# Patient Record
Sex: Female | Born: 1965 | Race: White | Hispanic: No | Smoking: Current every day smoker
Health system: Southern US, Community
[De-identification: ages and names within clinical notes are randomized; demographics above are authoritative.]

## PROBLEM LIST (undated history)

## (undated) DIAGNOSIS — J449 Chronic obstructive pulmonary disease, unspecified: Secondary | ICD-10-CM

## (undated) DIAGNOSIS — K219 Gastro-esophageal reflux disease without esophagitis: Secondary | ICD-10-CM

## (undated) DIAGNOSIS — F419 Anxiety disorder, unspecified: Secondary | ICD-10-CM

## (undated) DIAGNOSIS — M549 Dorsalgia, unspecified: Secondary | ICD-10-CM

## (undated) DIAGNOSIS — Q796 Ehlers-Danlos syndrome, unspecified: Secondary | ICD-10-CM

## (undated) HISTORY — PX: ABDOMINAL HYSTERECTOMY: SHX81

## (undated) HISTORY — PX: TONSILLECTOMY: SUR1361

## (undated) HISTORY — PX: BACK SURGERY: SHX140

## (undated) HISTORY — PX: KNEE ARTHROSCOPY: SUR90

---

## 2009-08-10 ENCOUNTER — Encounter: Payer: Self-pay | Admitting: Emergency Medicine

## 2009-08-10 ENCOUNTER — Ambulatory Visit: Payer: Self-pay | Admitting: Diagnostic Radiology

## 2009-08-11 ENCOUNTER — Inpatient Hospital Stay (HOSPITAL_COMMUNITY): Admission: EM | Admit: 2009-08-11 | Discharge: 2009-08-12 | Payer: Self-pay | Admitting: Internal Medicine

## 2009-08-17 ENCOUNTER — Ambulatory Visit: Payer: Self-pay | Admitting: Diagnostic Radiology

## 2009-08-17 ENCOUNTER — Ambulatory Visit (HOSPITAL_BASED_OUTPATIENT_CLINIC_OR_DEPARTMENT_OTHER): Admission: RE | Admit: 2009-08-17 | Discharge: 2009-08-17 | Payer: Self-pay | Admitting: Family Medicine

## 2009-08-27 ENCOUNTER — Ambulatory Visit (HOSPITAL_COMMUNITY): Admission: RE | Admit: 2009-08-27 | Discharge: 2009-08-27 | Payer: Self-pay | Admitting: Family Medicine

## 2010-10-07 IMAGING — CT CT ABDOMEN W/ CM
2 of 5 series · 16 of 46 positions shown, 18 images · IV contrast (APPLIED)
Comparison: None

CT ABDOMEN

CLINICAL DATA: Right-sided abdominal pain.

CT ABDOMEN AND PELVIS WITH CONTRAST
TECHNIQUE: Multidetector CT imaging of the abdomen and pelvis was
performed using the standard protocol following bolus
administration of intravenous contrast.
Contrast: 100 ml of [id number]

[Series 2: abd/pelvis 5.0 b31f · axial · 0.79mm/px · z∈[-560,-160]mm · 13 of 91 slices shown, 15 images]
[im 6/91  soft-tissue]
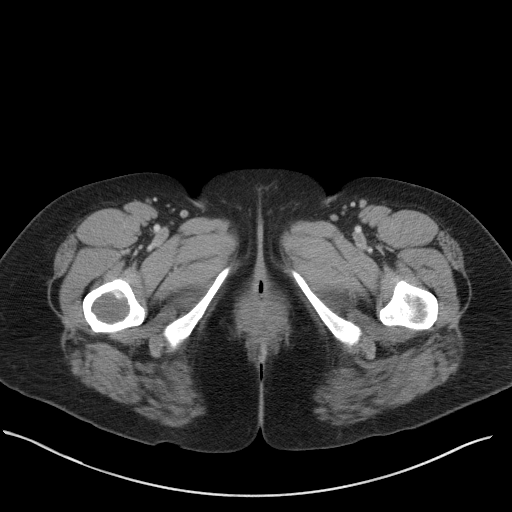
[im 6/91  bone]
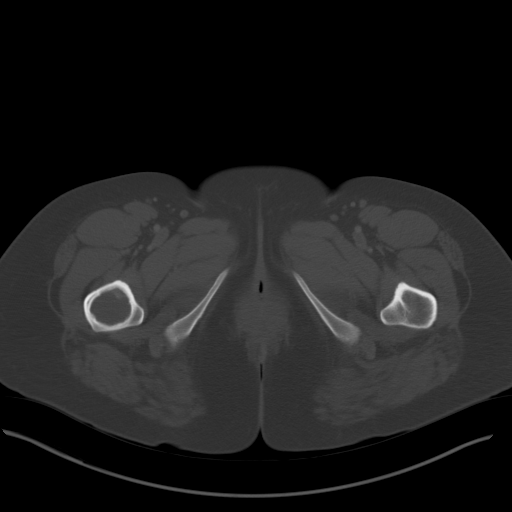
[im 11/91  soft-tissue]
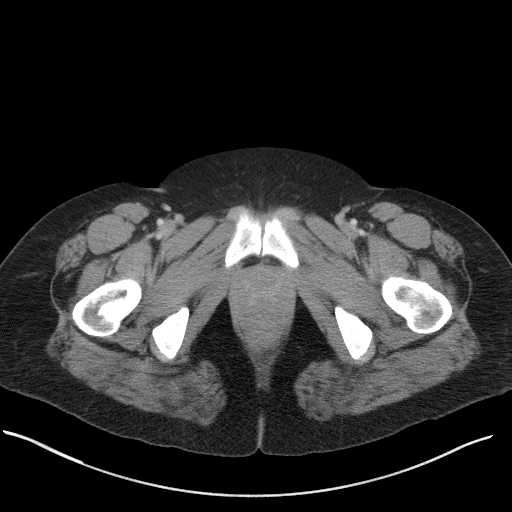
[im 21/91  soft-tissue]
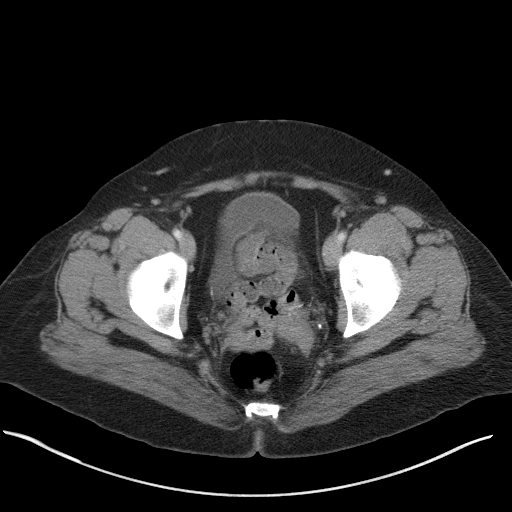
[im 26/91  soft-tissue]
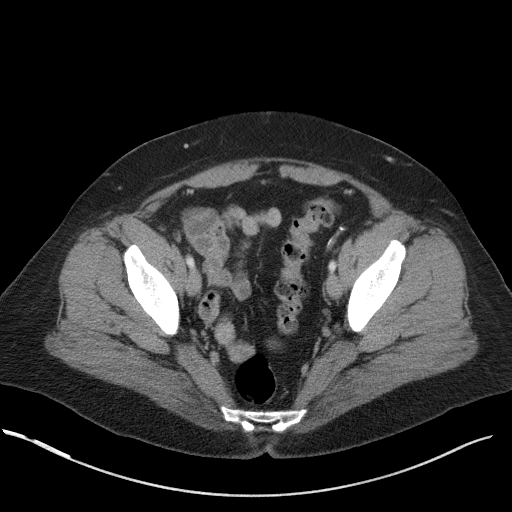
[im 31/91  soft-tissue]
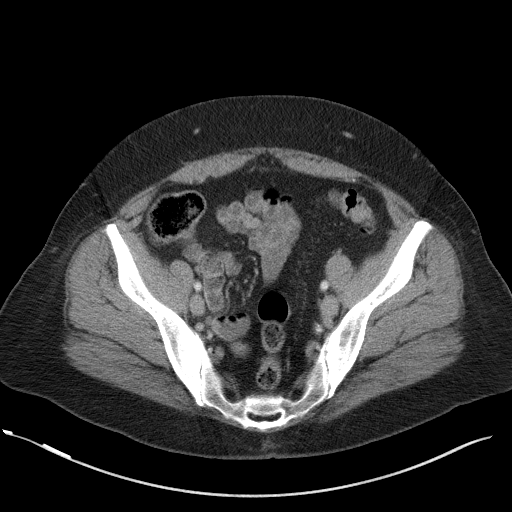
[im 41/91  soft-tissue]
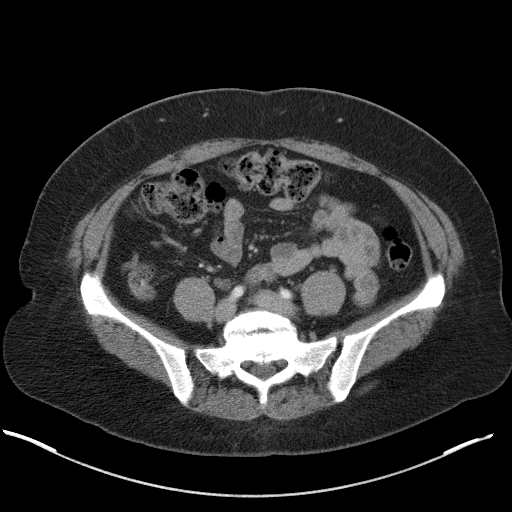
[im 46/91  soft-tissue]
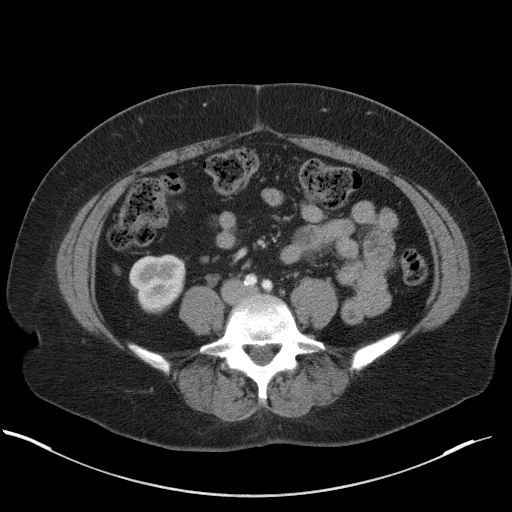
[im 51/91  soft-tissue]
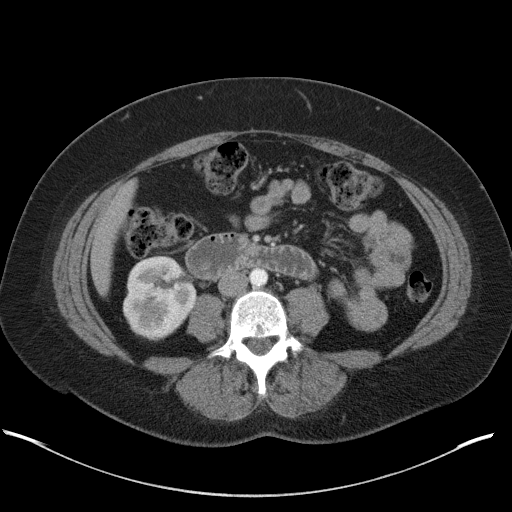
[im 61/91  soft-tissue]
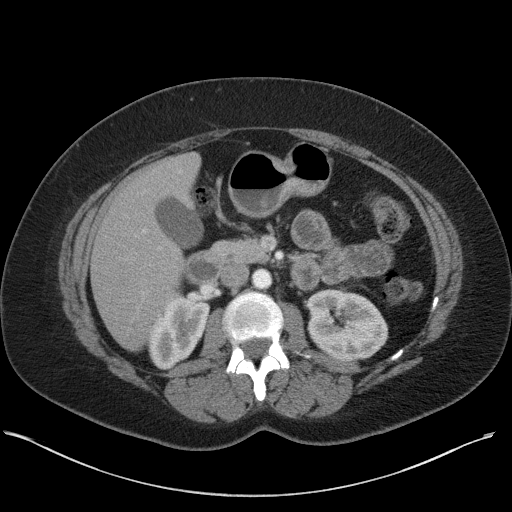
[im 61/91  bone]
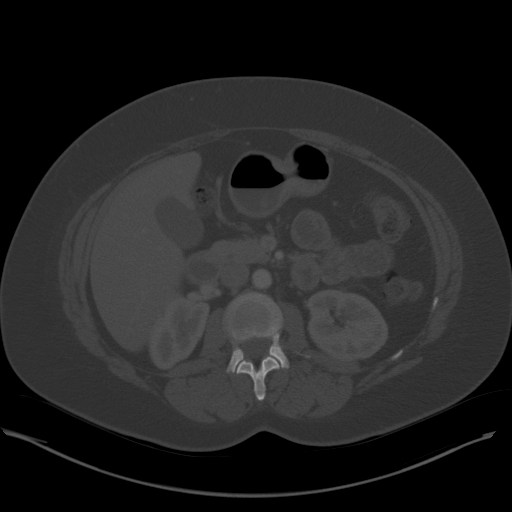
[im 66/91  soft-tissue]
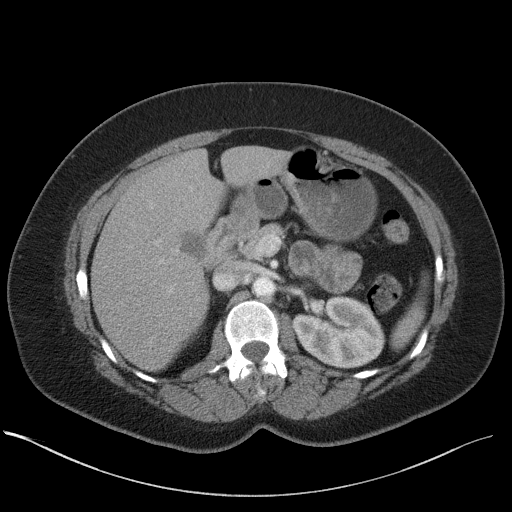
[im 71/91  soft-tissue]
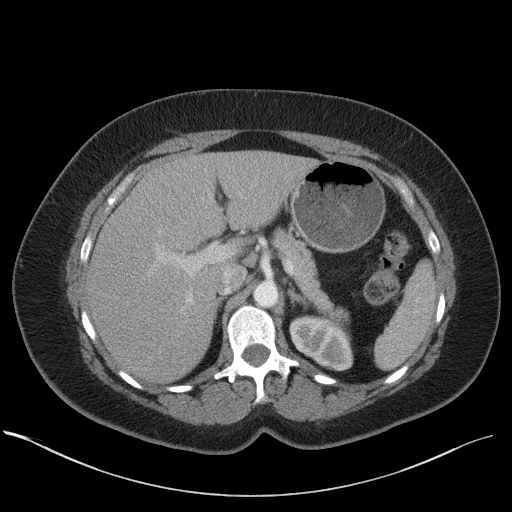
[im 81/91  soft-tissue]
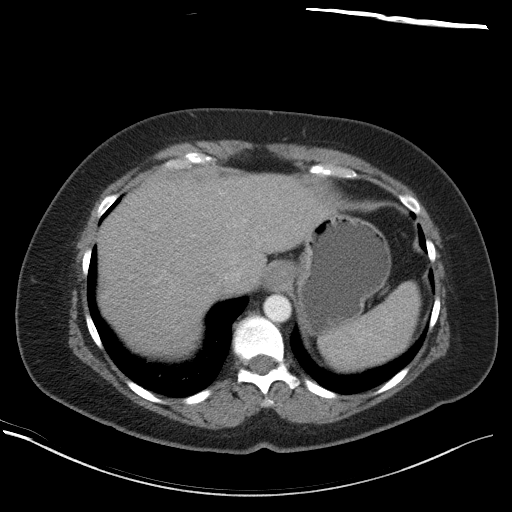
[im 86/91  soft-tissue]
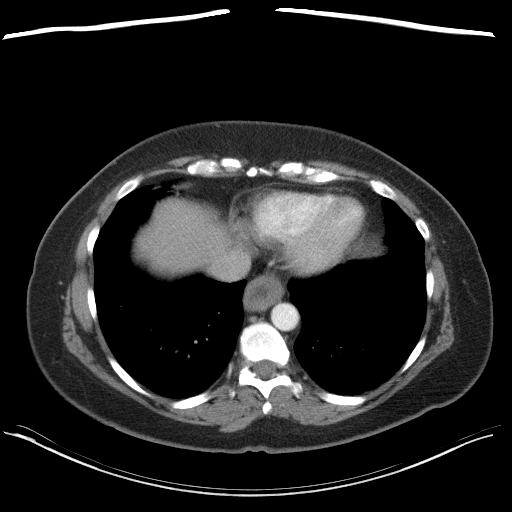

[Series 5: abd/pelvis 3.0 coronal · coronal · 0.86mm/px · 3 of 93 slices shown]
[im 31/93  soft-tissue]
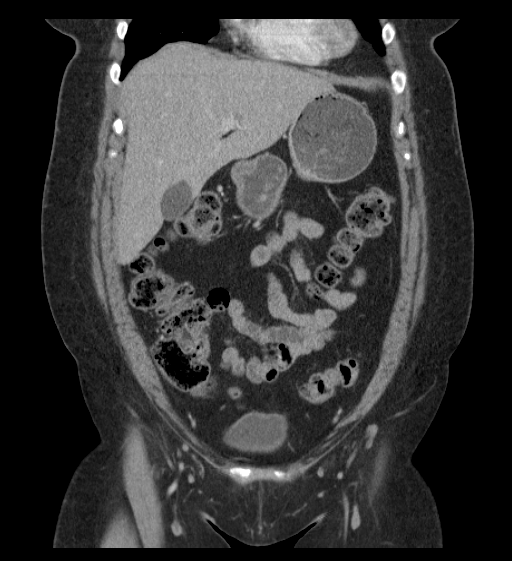
[im 41/93  soft-tissue]
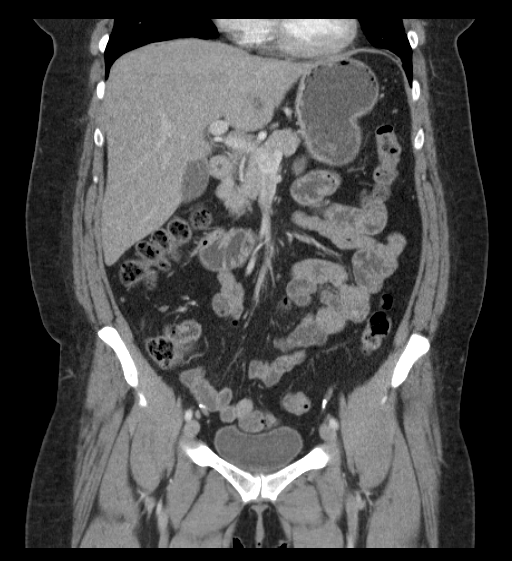
[im 52/93  soft-tissue]
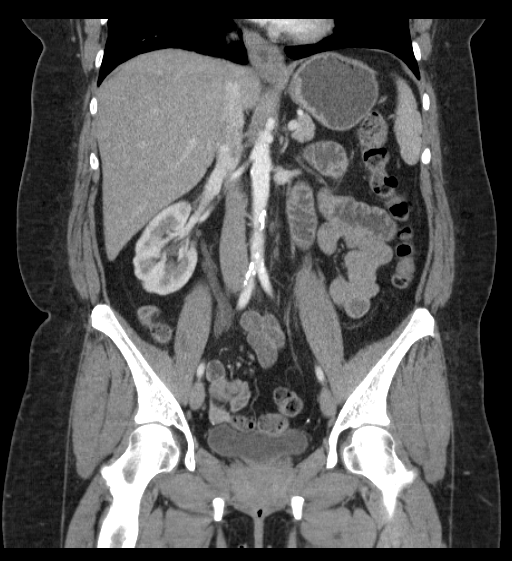

[16 of 46 positions shown; findings below may reference images not displayed]

FINDINGS: The lung bases are clear except for dependent
atelectasis.  The distal esophagus demonstrates marked wall
thickening and is fluid distended.  Findings may be secondary to
reflux esophagitis.  Recommend clinical correlation.  There are
esophagram or endoscopy may be helpful for further evaluation.

The liver, spleen, pancreas, adrenal glands and kidneys are
unremarkable.  The stomach, duodenum, small bowel and colon are
unremarkable.

The aorta is normal in caliber.  Major branch vessels are normal.
No mesenteric or retroperitoneal masses or adenopathy.

The bony structures are unremarkable.
IMPRESSION: 1.  Marked distal esophageal wall thickening and a fluid-filled
distended esophagus may be secondary to reflux esophagitis.
Correlation with barium esophagram or endoscopy is suggested.
2.  No acute abdominal findings, mass lesions or adenopathy.

CT PELVIS
FINDINGS: The rectum, sigmoid colon visualized small bowel loops
are unremarkable.  The appendix is normal.  The bladder appears
normal.  No distal ureteral or bladder calculi.  No pelvic mass,
adenopathy or free pelvic fluid collections.  No inguinal mass or
hernia.  The bony pelvis is intact.  There is a lytic lesion in the
posterior aspect of the acetabulum and a large lesion involving the
proximal femur.  Findings could be secondary to fibrous dysplasia
or bone cysts.  MRI suggested for further evaluation.

No inguinal mass or hernia.
IMPRESSION: 1.  No acute pelvic findings, mass lesions or adenopathy.
2.  Lytic lesions in the right femur and acetabulum.  MRI suggested
for further evaluation.

## 2011-02-18 LAB — DIFFERENTIAL
Basophils Absolute: 0 10*3/uL (ref 0.0–0.1)
Basophils Relative: 0 % (ref 0–1)
Eosinophils Absolute: 0 10*3/uL (ref 0.0–0.7)
Eosinophils Absolute: 0.2 10*3/uL (ref 0.0–0.7)
Eosinophils Relative: 0 % (ref 0–5)
Lymphocytes Relative: 27 % (ref 12–46)
Lymphs Abs: 1.1 10*3/uL (ref 0.7–4.0)
Monocytes Relative: 5 % (ref 3–12)
Neutrophils Relative %: 67 % (ref 43–77)

## 2011-02-18 LAB — COMPREHENSIVE METABOLIC PANEL
AST: 21 U/L (ref 0–37)
Albumin: 3.1 g/dL — ABNORMAL LOW (ref 3.5–5.2)
Alkaline Phosphatase: 87 U/L (ref 39–117)
BUN: 17 mg/dL (ref 6–23)
CO2: 28 mEq/L (ref 19–32)
Calcium: 9.9 mg/dL (ref 8.4–10.5)
Chloride: 105 mEq/L (ref 96–112)
GFR calc Af Amer: >60 mL/min (ref >60)
GFR calc non Af Amer: >60 mL/min (ref >60)
Glucose, Bld: 123 mg/dL — ABNORMAL HIGH (ref 70–99)
Sodium: 141 mEq/L (ref 135–145)
Total Bilirubin: 0.3 mg/dL (ref 0.3–1.2)
Total Bilirubin: 0.5 mg/dL (ref 0.3–1.2)
Total Protein: 7.9 g/dL (ref 6.0–8.3)

## 2011-02-18 LAB — URINALYSIS, ROUTINE W REFLEX MICROSCOPIC
Hgb urine dipstick: NEGATIVE
Ketones, ur: NEGATIVE mg/dL
Protein, ur: NEGATIVE mg/dL
Specific Gravity, Urine: 1.004 — ABNORMAL LOW (ref 1.005–1.030)
Urobilinogen, UA: 0.2 mg/dL (ref 0.0–1.0)
pH: 6 (ref 5.0–8.0)

## 2011-02-18 LAB — CBC
HCT: 35.6 % — ABNORMAL LOW (ref 36.0–46.0)
MCHC: 34.5 g/dL (ref 30.0–36.0)
MCV: 85.8 fL (ref 78.0–100.0)
RBC: 4.16 MIL/uL (ref 3.87–5.11)
RBC: 4.7 MIL/uL (ref 3.87–5.11)
WBC: 16.6 10*3/uL — ABNORMAL HIGH (ref 4.0–10.5)

## 2011-02-18 LAB — RAPID URINE DRUG SCREEN, HOSP PERFORMED
Barbiturates: NOT DETECTED
Benzodiazepines: NOT DETECTED
Cocaine: NOT DETECTED

## 2011-02-18 LAB — LACTIC ACID, PLASMA: Lactic Acid, Venous: 1.1 mmol/L (ref 0.5–2.2)

## 2013-07-24 LAB — MAGNESIUM: Magnesium: 1.7 mg/dL (ref 1.6–2.4)

## 2013-07-24 LAB — VITAMIN D 25 HYDROXY (VIT D DEFICIENCY, FRACTURES): Vit D, 25-Hydroxy: 25.8

## 2013-07-24 LAB — TSH: TSH: 0.64

## 2013-12-02 ENCOUNTER — Encounter: Payer: Self-pay | Admitting: Emergency Medicine

## 2013-12-02 ENCOUNTER — Emergency Department (INDEPENDENT_AMBULATORY_CARE_PROVIDER_SITE_OTHER)
Admission: EM | Admit: 2013-12-02 | Discharge: 2013-12-02 | Disposition: A | Discharge status: Home / Self Care | Payer: BC Managed Care – PPO | Source: Home / Self Care | Attending: Family Medicine | Admitting: Family Medicine

## 2013-12-02 DIAGNOSIS — J111 Influenza due to unidentified influenza virus with other respiratory manifestations: Secondary | ICD-10-CM

## 2013-12-02 DIAGNOSIS — R05 Cough: Secondary | ICD-10-CM

## 2013-12-02 DIAGNOSIS — R69 Illness, unspecified: Principal | ICD-10-CM

## 2013-12-02 DIAGNOSIS — R059 Cough, unspecified: Secondary | ICD-10-CM

## 2013-12-02 DIAGNOSIS — R112 Nausea with vomiting, unspecified: Secondary | ICD-10-CM

## 2013-12-02 DIAGNOSIS — R509 Fever, unspecified: Secondary | ICD-10-CM

## 2013-12-02 HISTORY — DX: Ehlers-Danlos syndrome, unspecified: Q79.60

## 2013-12-02 HISTORY — DX: Dorsalgia, unspecified: M54.9

## 2013-12-02 HISTORY — DX: Chronic obstructive pulmonary disease, unspecified: J44.9

## 2013-12-02 HISTORY — DX: Gastro-esophageal reflux disease without esophagitis: K21.9

## 2013-12-02 HISTORY — DX: Anxiety disorder, unspecified: F41.9

## 2013-12-02 LAB — POCT INFLUENZA A/B
INFLUENZA A, POC: NEGATIVE
Influenza B, POC: NEGATIVE

## 2013-12-02 MED ORDER — ONDANSETRON 4 MG PO TBDP: 4.0000 mg | ORAL_TABLET | Freq: Once | ORAL | Status: DC

## 2013-12-02 MED ORDER — HYDROCOD POLST-CHLORPHEN POLST 10-8 MG/5ML PO LQCR: 5.0000 mL | Freq: Every evening | ORAL | Status: DC | PRN

## 2013-12-02 MED ORDER — ONDANSETRON 4 MG PO TBDP: ORAL_TABLET | ORAL | Status: DC

## 2013-12-02 MED ORDER — BENZONATATE 200 MG PO CAPS: 200.0000 mg | ORAL_CAPSULE | Freq: Every day | ORAL | Status: DC

## 2013-12-02 MED ORDER — OSELTAMIVIR PHOSPHATE 75 MG PO CAPS: 75.0000 mg | ORAL_CAPSULE | Freq: Two times a day (BID) | ORAL | Status: DC

## 2013-12-02 MED ORDER — CEFDINIR 300 MG PO CAPS: 300.0000 mg | ORAL_CAPSULE | Freq: Two times a day (BID) | ORAL | Status: DC

## 2013-12-02 NOTE — Discharge Instructions (Signed)
Take plain Mucinex (1200 mg guaifenesin) twice daily for cough and congestion.  May add Sudafed for sinus congestion.   Begin clear liquids for about 12 hours until nausea improves, then advance diet.  Rest, and continue increased fluid intake. May use Afrin nasal spray (or generic oxymetazoline) twice daily for about 5 days.  Also recommend using saline nasal spray several times daily and saline nasal irrigation (AYR is a common brand) Try warm salt water gargles for sore throat.  Stop all antihistamines for now, and other non-prescription cough/cold preparations. May take Ibuprofen 200mg , 4 tabs every 8 hours with food for chest/sternum discomfort, body aches, fever, etc. If symptoms become significantly worse during the night or over the weekend, proceed to the local emergency room.

## 2013-12-02 NOTE — ED Notes (Signed)
Reports cough, congestion, fever and body aches since yesterday. No flu vaccination this season. Took Goodie Powder one hour ago.

## 2013-12-02 NOTE — ED Provider Notes (Signed)
CSN: [id number]     Arrival date & time 12/02/13  1105 History   First MD Initiated Contact with Patient 12/02/13 1148     Chief Complaint  Patient presents with  . Cough  . Nasal Congestion  . Fever  . Generalized Body Aches     HPI Comments: Patient awoke yesterday with nausea/vomiting, fatigue, headache, chills, mild sore throat, non-productive cough, and nasal congestion.  She has occasional shortness of breath and wheezing.  She had a temperature to 100 last night.  Her vomiting has resolved, but she still has nausea. She has a history of COPD, and continues to smoke.  She has had frequent episodes of bronchitis in the past, and pneumonia about 4 years ago.  She uses Symbicort twice daily.  The history is provided by the patient.    Past Medical History  Diagnosis Date  . COPD (chronic obstructive pulmonary disease)   . GERD (gastroesophageal reflux disease)   . Anxiety   . Back pain   . Fibrodysplasia elastica generalisata    Past Surgical History  Procedure Laterality Date  . Tonsillectomy    . Abdominal hysterectomy    . Knee arthroscopy Left   . Back surgery     History reviewed. No pertinent family history. History  Substance Use Topics  . Smoking status: Current Every Day Smoker  . Smokeless tobacco: Not on file  . Alcohol Use: Yes   OB History   Grav Para Term Preterm Abortions TAB SAB Ect Mult Living                 Review of Systems + sore throat + cough No pleuritic pain + wheezing + nasal congestion + post-nasal drainage No sinus pain/pressure No itchy/red eyes ? earache No hemoptysis + SOB + fever, + chills + nausea + vomiting, resolved No abdominal pain No diarrhea No urinary symptoms No skin rash + fatigue + myalgias + headache Used OTC meds without relief  Allergies  Codeine  Home Medications   Current Outpatient Rx  Name  Route  Sig  Dispense  Refill  . ALPRAZolam (XANAX) 0.5 MG tablet   Oral   Take 0.5 mg by mouth at  bedtime as needed for anxiety.         . budesonide-formoterol (SYMBICORT) 160-4.5 MCG/ACT inhaler   Inhalation   Inhale 2 puffs into the lungs 2 (two) times daily.         Marland Kitchen estradiol (VIVELLE-DOT) 0.025 MG/24HR   Transdermal   Place 1 patch onto the skin 2 (two) times a week.         Marland Kitchen omeprazole (PRILOSEC) 20 MG capsule   Oral   Take 20 mg by mouth daily.         . benzonatate (TESSALON) 200 MG capsule   Oral   Take 1 capsule (200 mg total) by mouth at bedtime. Take as needed for cough   12 capsule   0   . cefdinir (OMNICEF) 300 MG capsule   Oral   Take 1 capsule (300 mg total) by mouth 2 (two) times daily.   20 capsule   0   . ondansetron (ZOFRAN ODT) 4 MG disintegrating tablet      Take one tab by mouth Q6hr prn nausea   12 tablet   0   . oseltamivir (TAMIFLU) 75 MG capsule   Oral   Take 1 capsule (75 mg total) by mouth every 12 (twelve) hours.   10 capsule  0    BP 143/87  Pulse 89  Temp(Src) 97.9 F (36.6 C) (Oral)  Resp 18  Ht 5\' 2"  (1.575 m)  Wt 192 lb (87.091 kg)  BMI 35.11 kg/m2  SpO2 99% Physical Exam Nursing notes and Vital Signs reviewed. Appearance:  Patient appears stated age, and in no acute distress.  Patient is obese (BMI 35.1) Eyes:  Pupils are equal, round, and reactive to light and accomodation.  Extraocular movement is intact.  Conjunctivae are not inflamed  Ears:  Canals normal.  Tympanic membranes normal.  Nose:  Mildly congested turbinates.  No sinus tenderness.    Pharynx:  Normal Neck:  Supple.   Tender shotty left posterior nodes are palpated  Lungs:  Clear to auscultation.  Breath sounds are equal. Chest:  Distinct tenderness to palpation over the mid-sternum.   Heart:  Regular rate and rhythm without murmurs, rubs, or gallops.  Abdomen:  Nontender without masses or hepatosplenomegaly.  Bowel sounds are present.  No CVA or flank tenderness.  Extremities:  No edema.  No calf tenderness Skin:  No rash present.   ED  Course  Procedures  None    Labs Reviewed  POCT INFLUENZA A/B negative         MDM   1. Influenza-like illness   2. Nausea with vomiting    Administered oral Zofran ODT 4mg  po Despite negative flu test, will begin Tamiflu. With a history of COPD, pneumonia, and frequent bronchitis, will begin empiric Omnicef. Take plain Mucinex (1200 mg guaifenesin) twice daily for cough and congestion.  May add Sudafed for sinus congestion.   Begin clear liquids for about 12 hours until nausea improves, then advance diet.  Rest, and continue increased fluid intake. May use Afrin nasal spray (or generic oxymetazoline) twice daily for about 5 days.  Also recommend using saline nasal spray several times daily and saline nasal irrigation (AYR is a common brand) Try warm salt water gargles for sore throat.  Stop all antihistamines for now, and other non-prescription cough/cold preparations. May take Ibuprofen 200mg , 4 tabs every 8 hours with food for chest/sternum discomfort, body aches, fever, etc. Followup with Family Doctor if not improved in one week.  If symptoms become significantly worse during the night or over the weekend, proceed to the local emergency room.  Addendum:  Call from pharmacy; Tessalon on backorder.  Well substitute tussionex 5cc at bedtime (she states that she has no adverse effects from hydrocodone).    Lattie HawStephen A Azul Coffie, MD 12/02/13 [phone]

## 2013-12-04 ENCOUNTER — Telehealth: Payer: Self-pay | Admitting: *Deleted

## 2013-12-04 ENCOUNTER — Emergency Department (INDEPENDENT_AMBULATORY_CARE_PROVIDER_SITE_OTHER)
Admission: EM | Admit: 2013-12-04 | Discharge: 2013-12-04 | Disposition: A | Discharge status: Home / Self Care | Payer: BC Managed Care – PPO | Source: Home / Self Care | Attending: Family Medicine | Admitting: Family Medicine

## 2013-12-04 ENCOUNTER — Emergency Department (INDEPENDENT_AMBULATORY_CARE_PROVIDER_SITE_OTHER): Payer: BC Managed Care – PPO

## 2013-12-04 ENCOUNTER — Encounter: Payer: Self-pay | Admitting: Emergency Medicine

## 2013-12-04 DIAGNOSIS — R69 Illness, unspecified: Principal | ICD-10-CM

## 2013-12-04 DIAGNOSIS — R053 Chronic cough: Secondary | ICD-10-CM

## 2013-12-04 DIAGNOSIS — R509 Fever, unspecified: Secondary | ICD-10-CM

## 2013-12-04 DIAGNOSIS — J111 Influenza due to unidentified influenza virus with other respiratory manifestations: Secondary | ICD-10-CM

## 2013-12-04 DIAGNOSIS — R059 Cough, unspecified: Secondary | ICD-10-CM

## 2013-12-04 DIAGNOSIS — R05 Cough: Secondary | ICD-10-CM

## 2013-12-04 DIAGNOSIS — Z8701 Personal history of pneumonia (recurrent): Secondary | ICD-10-CM

## 2013-12-04 LAB — POCT CBC W AUTO DIFF (K'VILLE URGENT CARE)

## 2013-12-04 MED ORDER — PREDNISONE 20 MG PO TABS: 20.0000 mg | ORAL_TABLET | Freq: Two times a day (BID) | ORAL | Status: DC

## 2013-12-04 NOTE — Discharge Instructions (Signed)
Continue all present medications and inhalers.  Continue increased fluids.

## 2013-12-04 NOTE — ED Provider Notes (Signed)
CSN: [id number]     Arrival date & time 12/04/13  1133 History   First MD Initiated Contact with Patient 12/04/13 1150     Chief Complaint  Patient presents with  . Cough      HPI Comments: Patient was evaluated here two days ago for a respiratory infection and treated with Tamiflu and Omnicef.  She complains of persistent non-productive cough and chills/sweats.  She has had persistent low grade fever to 99.1.  She has had a persistent sore throat and wheezing, but minimal sinus congestion.  No pleuritic pain.  She continues to use her Symbicort and rescue albuterol MDI.   The history is provided by the patient and the spouse.    Past Medical History  Diagnosis Date  . COPD (chronic obstructive pulmonary disease)   . GERD (gastroesophageal reflux disease)   . Anxiety   . Back pain   . Fibrodysplasia elastica generalisata    Past Surgical History  Procedure Laterality Date  . Tonsillectomy    . Abdominal hysterectomy    . Knee arthroscopy Left   . Back surgery     History reviewed. No pertinent family history. History  Substance Use Topics  . Smoking status: Current Every Day Smoker  . Smokeless tobacco: Not on file  . Alcohol Use: Yes   OB History   Grav Para Term Preterm Abortions TAB SAB Ect Mult Living                 Review of Systems + sore throat + cough No pleuritic pain + wheezing + nasal congestion + post-nasal drainage ? sinus pain/pressure No itchy/red eyes No earache No hemoptysis No SOB + fever, + chills No nausea + vomiting after cough No abdominal pain No diarrhea No urinary symptoms No skin rash + fatigue + myalgias + headache    Allergies  Codeine  Home Medications   Current Outpatient Rx  Name  Route  Sig  Dispense  Refill  . ALPRAZolam (XANAX) 0.5 MG tablet   Oral   Take 0.5 mg by mouth at bedtime as needed for anxiety.         . budesonide-formoterol (SYMBICORT) 160-4.5 MCG/ACT inhaler   Inhalation   Inhale 2 puffs into  the lungs 2 (two) times daily.         . cefdinir (OMNICEF) 300 MG capsule   Oral   Take 1 capsule (300 mg total) by mouth 2 (two) times daily.   20 capsule   0   . chlorpheniramine-HYDROcodone (TUSSIONEX PENNKINETIC ER) 10-8 MG/5ML LQCR   Oral   Take 5 mLs by mouth at bedtime as needed for cough.   60 mL   0   . estradiol (VIVELLE-DOT) 0.025 MG/24HR   Transdermal   Place 1 patch onto the skin 2 (two) times a week.         Marland Kitchen omeprazole (PRILOSEC) 20 MG capsule   Oral   Take 20 mg by mouth daily.         . ondansetron (ZOFRAN ODT) 4 MG disintegrating tablet      Take one tab by mouth Q6hr prn nausea   12 tablet   0   . oseltamivir (TAMIFLU) 75 MG capsule   Oral   Take 1 capsule (75 mg total) by mouth every 12 (twelve) hours.   10 capsule   0   . predniSONE (DELTASONE) 20 MG tablet   Oral   Take 1 tablet (20 mg total) by mouth  2 (two) times daily. Take with food.   10 tablet   0    BP 139/92  Pulse 110  Temp(Src) 98.8 F (37.1 C) (Oral)  Resp 20  Ht 5\' 2"  (1.575 m)  Wt 194 lb (87.998 kg)  BMI 35.47 kg/m2  SpO2 96% Physical Exam Nursing notes and Vital Signs reviewed. Appearance:  Patient appears stated age, and in no acute distress.  Patient is obese (BMI 35.5) Eyes:  Pupils are equal, round, and reactive to light and accomodation.  Extraocular movement is intact.  Conjunctivae are not inflamed  Ears:  Canals normal.  Tympanic membranes normal.  Nose:  Mildly congested turbinates.  No sinus tenderness.   Pharynx:  Normal Neck:  Supple.  Slightly tender shotty posterior nodes are palpated bilaterally  Lungs:  Clear to auscultation.  Breath sounds are equal.  Heart:  Regular rate and rhythm without murmurs, rubs, or gallops.  Abdomen:  Nontender without masses or hepatosplenomegaly.  Bowel sounds are present.  No CVA or flank tenderness.  Extremities:  No edema.  No calf tenderness Skin:  No rash present.   ED Course  Procedures  none    Labs  Reviewed  POCT CBC W AUTO DIFF ([facility]) WBC 9.6; LY 32.2; MO 4.6; GR 63.2; Hgb 15.9; Platelets 154    Imaging Review Dg Chest 2 View  12/04/2013   CLINICAL DATA:  Cough and fever.  EXAM: CHEST  2 VIEW  COMPARISON:  None.  FINDINGS: The heart size and mediastinal contours are within normal limits. Both lungs are clear. The visualized skeletal structures are unremarkable.  IMPRESSION: No active cardiopulmonary disease.   Electronically Signed   By: Marlan Palauharles  Clark M.D.   On: 12/04/2013 12:50      MDM   1. Influenza-like illness   2. Persistent dry cough; normal WBC is reassuring    Begin prednisone burst. Continue all present medications and inhalers.  Continue increased fluids. Followup with Family Doctor if not improved in one week.     Lattie HawStephen A Beese, MD 12/04/13 2042

## 2013-12-04 NOTE — ED Notes (Signed)
The pt is here today for a recheck from her visit 2 days ago. She reports that she is no better.

## 2014-06-09 ENCOUNTER — Encounter: Payer: Self-pay | Admitting: Physician Assistant

## 2014-06-09 ENCOUNTER — Ambulatory Visit (INDEPENDENT_AMBULATORY_CARE_PROVIDER_SITE_OTHER): Payer: BC Managed Care – PPO | Admitting: Physician Assistant

## 2014-06-09 VITALS — BP 119/76 | HR 102 | Ht 64.5 in | Wt 195.0 lb

## 2014-06-09 DIAGNOSIS — J449 Chronic obstructive pulmonary disease, unspecified: Secondary | ICD-10-CM | POA: Insufficient documentation

## 2014-06-09 DIAGNOSIS — N951 Menopausal and female climacteric states: Secondary | ICD-10-CM

## 2014-06-09 DIAGNOSIS — K219 Gastro-esophageal reflux disease without esophagitis: Secondary | ICD-10-CM

## 2014-06-09 DIAGNOSIS — F4310 Post-traumatic stress disorder, unspecified: Secondary | ICD-10-CM | POA: Diagnosis not present

## 2014-06-09 DIAGNOSIS — Z860100 Personal history of colon polyps, unspecified: Secondary | ICD-10-CM | POA: Diagnosis not present

## 2014-06-09 DIAGNOSIS — Q796 Ehlers-Danlos syndrome, unspecified: Secondary | ICD-10-CM | POA: Diagnosis not present

## 2014-06-09 DIAGNOSIS — J410 Simple chronic bronchitis: Secondary | ICD-10-CM | POA: Diagnosis not present

## 2014-06-09 DIAGNOSIS — Z780 Asymptomatic menopausal state: Secondary | ICD-10-CM

## 2014-06-09 MED ORDER — ALPRAZOLAM 0.5 MG PO TABS: 0.5000 mg | ORAL_TABLET | Freq: Three times a day (TID) | ORAL | Status: DC | PRN

## 2014-06-09 MED ORDER — TRAMADOL HCL 50 MG PO TABS: ORAL_TABLET | ORAL | Status: DC

## 2014-06-09 MED ORDER — BUDESONIDE-FORMOTEROL FUMARATE 160-4.5 MCG/ACT IN AERO: 2.0000 | INHALATION_SPRAY | Freq: Two times a day (BID) | RESPIRATORY_TRACT | Status: DC

## 2014-06-09 MED ORDER — ESTRADIOL 0.025 MG/24HR TD PTTW: 1.0000 | MEDICATED_PATCH | TRANSDERMAL | Status: DC

## 2014-06-09 MED ORDER — OMEPRAZOLE 20 MG PO CPDR: 20.0000 mg | DELAYED_RELEASE_CAPSULE | Freq: Two times a day (BID) | ORAL | Status: DC
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
## 2014-06-10 ENCOUNTER — Other Ambulatory Visit: Payer: Self-pay | Admitting: *Deleted

## 2014-06-10 ENCOUNTER — Encounter: Payer: Self-pay | Admitting: Physician Assistant

## 2014-06-10 DIAGNOSIS — N951 Menopausal and female climacteric states: Secondary | ICD-10-CM | POA: Insufficient documentation

## 2014-06-10 DIAGNOSIS — K219 Gastro-esophageal reflux disease without esophagitis: Secondary | ICD-10-CM | POA: Insufficient documentation

## 2014-06-10 MED ORDER — ALPRAZOLAM 0.5 MG PO TABS: 0.5000 mg | ORAL_TABLET | Freq: Three times a day (TID) | ORAL | Status: DC | PRN

## 2014-06-10 MED ORDER — ESTRADIOL 1 MG PO TABS: 1.0000 mg | ORAL_TABLET | Freq: Every morning | ORAL | Status: DC

## 2014-06-10 NOTE — Telephone Encounter (Signed)
Patient stopped in office for prescription correction. Kristi Odonnell, CMA

## 2014-08-24 ENCOUNTER — Other Ambulatory Visit: Payer: Self-pay | Admitting: Physician Assistant

## 2014-09-16 ENCOUNTER — Other Ambulatory Visit: Payer: Self-pay | Admitting: Physician Assistant

## 2014-10-31 ENCOUNTER — Encounter: Payer: Self-pay | Admitting: Physician Assistant

## 2014-10-31 ENCOUNTER — Ambulatory Visit (INDEPENDENT_AMBULATORY_CARE_PROVIDER_SITE_OTHER): Payer: BC Managed Care – PPO | Admitting: Physician Assistant

## 2014-10-31 VITALS — BP 137/88 | HR 88 | Ht 64.5 in | Wt 187.0 lb

## 2014-10-31 DIAGNOSIS — Z780 Asymptomatic menopausal state: Secondary | ICD-10-CM | POA: Diagnosis not present

## 2014-10-31 DIAGNOSIS — F4310 Post-traumatic stress disorder, unspecified: Secondary | ICD-10-CM | POA: Diagnosis not present

## 2014-10-31 DIAGNOSIS — J410 Simple chronic bronchitis: Secondary | ICD-10-CM

## 2014-10-31 DIAGNOSIS — K219 Gastro-esophageal reflux disease without esophagitis: Secondary | ICD-10-CM

## 2014-10-31 DIAGNOSIS — Q796 Ehlers-Danlos syndrome, unspecified: Secondary | ICD-10-CM

## 2014-10-31 DIAGNOSIS — N951 Menopausal and female climacteric states: Secondary | ICD-10-CM

## 2014-10-31 DIAGNOSIS — F411 Generalized anxiety disorder: Secondary | ICD-10-CM

## 2014-10-31 MED ORDER — CITALOPRAM HYDROBROMIDE 10 MG PO TABS: 10.0000 mg | ORAL_TABLET | Freq: Every day | ORAL | Status: DC

## 2014-10-31 MED ORDER — OMEPRAZOLE 20 MG PO CPDR: 20.0000 mg | DELAYED_RELEASE_CAPSULE | Freq: Two times a day (BID) | ORAL | Status: DC

## 2014-10-31 MED ORDER — BUDESONIDE-FORMOTEROL FUMARATE 160-4.5 MCG/ACT IN AERO: 2.0000 | INHALATION_SPRAY | Freq: Two times a day (BID) | RESPIRATORY_TRACT | Status: DC

## 2014-10-31 MED ORDER — ESTRADIOL 1 MG PO TABS: 1.0000 mg | ORAL_TABLET | Freq: Every morning | ORAL | Status: DC

## 2014-10-31 MED ORDER — BUDESONIDE-FORMOTEROL FUMARATE 160-4.5 MCG/ACT IN AERO
2.0000 | INHALATION_SPRAY | Freq: Two times a day (BID) | RESPIRATORY_TRACT | Status: AC
Start: 2014-10-31 — End: ?

## 2014-10-31 MED ORDER — TRAMADOL HCL 50 MG PO TABS: ORAL_TABLET | ORAL | Status: DC

## 2014-10-31 MED ORDER — ALPRAZOLAM 0.5 MG PO TABS: ORAL_TABLET | ORAL | Status: DC

## 2014-10-31 NOTE — Progress Notes (Signed)
   Subjective:    Patient ID: Kristi CambridgeCindy Mcmullen, female    DOB: 02-17-1966, 48 y.o.   MRN: [id number]  HPI Pt presents to the clinic for medication refills.   GERD, pain, and COPD well controlled.   She does feel more down and anxious. She has her xanax tht helps but feeling more need to use more often. Denies any suicidal or homicidal thoughts. Tried zoloft in the past and feels like she had bad dreams with it. Would liek to try something else. Social situations worsen anxiety.    Review of Systems  All other systems reviewed and are negative.      Objective:   Physical Exam  Constitutional: She is oriented to person, place, and time. She appears well-developed and well-nourished.  HENT:  Head: Normocephalic and atraumatic.  Cardiovascular: Normal rate, regular rhythm and normal heart sounds.   Pulmonary/Chest: Effort normal and breath sounds normal.  Neurological: She is alert and oriented to person, place, and time.  Skin: Skin is dry.  Psychiatric: She has a normal mood and affect. Her behavior is normal.          Assessment & Plan:  GAD/PTSD- GAD-7 was 10. PHQ-9 was 5. Refilled xanax. Started celexa due to worsening anxiety. Discussed side effects. Follow up in 4-6 weeks.   GERD- refilled for 6 months.   Fibrodysplasia- refilled tramadol.   COPD- given symbicort refills

## 2014-12-24 ENCOUNTER — Other Ambulatory Visit: Payer: Self-pay | Admitting: Physician Assistant

## 2015-01-23 ENCOUNTER — Other Ambulatory Visit: Payer: Self-pay | Admitting: Physician Assistant

## 2015-02-09 DIAGNOSIS — Z789 Other specified health status: Secondary | ICD-10-CM | POA: Insufficient documentation

## 2015-02-09 DIAGNOSIS — F329 Major depressive disorder, single episode, unspecified: Secondary | ICD-10-CM | POA: Insufficient documentation

## 2015-02-09 DIAGNOSIS — Z7289 Other problems related to lifestyle: Secondary | ICD-10-CM | POA: Insufficient documentation

## 2015-03-11 ENCOUNTER — Ambulatory Visit (INDEPENDENT_AMBULATORY_CARE_PROVIDER_SITE_OTHER): Payer: BLUE CROSS/BLUE SHIELD | Admitting: Physician Assistant

## 2015-03-11 ENCOUNTER — Encounter (HOSPITAL_COMMUNITY): Payer: Self-pay | Admitting: Physician Assistant

## 2015-03-11 VITALS — BP 130/86 | HR 85 | Ht 64.5 in | Wt 180.0 lb

## 2015-03-11 DIAGNOSIS — F411 Generalized anxiety disorder: Secondary | ICD-10-CM

## 2015-03-11 DIAGNOSIS — F331 Major depressive disorder, recurrent, moderate: Secondary | ICD-10-CM | POA: Diagnosis not present

## 2015-03-11 MED ORDER — ESCITALOPRAM OXALATE 10 MG PO TABS: 10.0000 mg | ORAL_TABLET | Freq: Every day | ORAL | Status: DC

## 2015-03-11 MED ORDER — BUSPIRONE HCL 5 MG PO TABS: 5.0000 mg | ORAL_TABLET | Freq: Two times a day (BID) | ORAL | Status: DC
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
## 2015-03-11 NOTE — Patient Instructions (Signed)
1. Take all of your medications as discussed with your provider. (Please check your AVS, for the list.) STOP: Celexa, and the Hydroxyzine as discussed. Take Lexapro in AM. Take the Xanax twice a day as discussed. Take the Trazodone as written. Continue all of your other regular medications.  2. Call this office for any questions or problems. 3. Be sure to get plenty of rest and try for 7-9 hours of quality sleep each night. 4. Try to get regular exercise, at least 15-30 minutes each day.  A good walk will help tremendously! 5. Remember to do your mindfulness each day, breath deeply in and out, while having quiet reflection, prayer, meditation, or positive visualization. Unplug and turn off all electronic devices each day for your own personal time without interruption. This works! There are studies to back this up! 6. Be sure to take your B complex and Vitamin D3 each day. This will improve your overall wellbeing and boost your immune system as well. 7. Try to eat a nutritious healthy diet and avoid excessive alcohol and ALL tobacco products. 8. Be sure to keep all of your appointments with your outpatient therapist. If you do not have one, our office will be happy to assist you with this. 9. Be sure to keep your next follow up appointment in 2 weeks.

## 2015-03-25 ENCOUNTER — Ambulatory Visit (HOSPITAL_COMMUNITY): Payer: Self-pay | Admitting: Physician Assistant

## 2015-04-15 ENCOUNTER — Ambulatory Visit: Payer: Self-pay | Admitting: Family Medicine

## 2015-04-16 ENCOUNTER — Other Ambulatory Visit (HOSPITAL_COMMUNITY): Payer: Self-pay | Admitting: Physician Assistant

## 2015-05-03 ENCOUNTER — Other Ambulatory Visit (HOSPITAL_COMMUNITY): Payer: Self-pay | Admitting: Physician Assistant

## 2015-05-12 ENCOUNTER — Other Ambulatory Visit: Payer: Self-pay | Admitting: Physician Assistant

## 2015-05-15 ENCOUNTER — Other Ambulatory Visit: Payer: Self-pay

## 2015-05-15 MED ORDER — TRAMADOL HCL 50 MG PO TABS: ORAL_TABLET | ORAL | Status: DC

## 2015-05-15 MED ORDER — ESCITALOPRAM OXALATE 10 MG PO TABS: 10.0000 mg | ORAL_TABLET | Freq: Every day | ORAL | Status: DC

## 2015-06-01 ENCOUNTER — Encounter: Payer: Self-pay | Admitting: Family Medicine

## 2015-06-01 ENCOUNTER — Ambulatory Visit (INDEPENDENT_AMBULATORY_CARE_PROVIDER_SITE_OTHER): Payer: BLUE CROSS/BLUE SHIELD | Admitting: Family Medicine

## 2015-06-01 VITALS — BP 108/64 | HR 93 | Wt 177.0 lb

## 2015-06-01 DIAGNOSIS — K219 Gastro-esophageal reflux disease without esophagitis: Secondary | ICD-10-CM | POA: Diagnosis not present

## 2015-06-01 DIAGNOSIS — E538 Deficiency of other specified B group vitamins: Secondary | ICD-10-CM

## 2015-06-01 DIAGNOSIS — J410 Simple chronic bronchitis: Secondary | ICD-10-CM

## 2015-06-01 DIAGNOSIS — F411 Generalized anxiety disorder: Secondary | ICD-10-CM | POA: Diagnosis not present

## 2015-06-01 MED ORDER — OMEPRAZOLE 20 MG PO CPDR: 20.0000 mg | DELAYED_RELEASE_CAPSULE | Freq: Every day | ORAL | Status: AC

## 2015-06-01 MED ORDER — ESCITALOPRAM OXALATE 10 MG PO TABS: 10.0000 mg | ORAL_TABLET | Freq: Every day | ORAL | Status: DC

## 2015-06-01 NOTE — Progress Notes (Signed)
   Subjective:    Patient ID: Kristi Odonnell, female    DOB: Nov 26, 1965, 49 y.o.   MRN: [id number]  HPI F/U GAD/depression - six-month follow-up. She was seen back in December by her PCP, Tandy GawJade Breeback and was started on Celexa. She also uses Xanax as needed. She was then switched to Lexapro. She then saw one of our behavioral health specialists in April. She felt this was exacerbating her tremors. She was then switched to BuSpar 5 mg twice a day. Today she says she's not actually taking BuSpar and is taking the Lexapro 10 mg daily.  She has been going to therapy.  She also notes that she was actually hospitalized for mental issues back around Easter. She feels like she is finally getting back on track and taking care of herself again.  GERD - she is currently on omeprazole 20 mg twice daily. Has been in BID doseing for about 2 years.   COPD - she is doing ok with her breathing. Occ will wheeze late in the evening.  She is not using her Symbicort daily. Only when she can get it.   She also reports that she was diagnosed with B12 deficiency while she was hospitalized. I tried to get into records from [facility] but could not find any documentation of a B12 level. She said they actually gave her B12 shot while she was there. She's not sure if she needs to continue any type of supplementation or not.  Review of Systems     Objective:   Physical Exam  Constitutional: She is oriented to person, place, and time. She appears well-developed and well-nourished.  HENT:  Head: Normocephalic and atraumatic.  Cardiovascular: Normal rate, regular rhythm and normal heart sounds.   Pulmonary/Chest: Effort normal and breath sounds normal.  Neurological: She is alert and oriented to person, place, and time.  Skin: Skin is warm and dry.  Psychiatric: She has a normal mood and affect. Her behavior is normal.          Assessment & Plan:  GAD- GAD- 7 score of 11. She is sleeping much better.  Right now she is  happy with the Lexapro and her anxiety levels are still high. She was to continue her current regimen. Encouraged her to keep her appointments with the therapist downstairs as I do think this be really helpful for her. We just refilled her Xanax about 3 weeks ago. Follow-up in 3-4 months.  GERD- Encourage her to drop dose to once a days. We discussed the long-term potential effects of chronic PPI use. She says that while she was in the hospital she was only taking it once a day and that actually worked well. Encouraged her to do so now and if she tolerates well we might even be able to switch her to an H2 blocker at some point. Next  COPD-no recent exacerbations. She definitely does better when she's on her Cymbalta regularly. We did provide a sample for her today. Follow-up in 3 months. Also provided coupon card and new prescription sent to the pharmacy today.  B12 deficiency-recheck level and we can address appropriate replacement at that time.

## 2015-06-02 ENCOUNTER — Other Ambulatory Visit: Payer: Self-pay | Admitting: Sports Medicine

## 2015-06-02 ENCOUNTER — Other Ambulatory Visit: Payer: Self-pay | Admitting: Family Medicine

## 2015-06-02 LAB — COMPLETE METABOLIC PANEL WITH GFR
ALK PHOS: 65 U/L (ref 39–117)
ALT: <8 U/L (ref 0–35)
AST: 10 U/L (ref 0–37)
Albumin: 3.5 g/dL (ref 3.5–5.2)
BUN: 12 mg/dL (ref 6–23)
CALCIUM: 9 mg/dL (ref 8.4–10.5)
CHLORIDE: 101 meq/L (ref 96–112)
CO2: 26 mEq/L (ref 19–32)
Creat: 0.75 mg/dL (ref 0.50–1.10)
GFR, Est African American: >89 mL/min
GLUCOSE: 80 mg/dL (ref 70–99)
Potassium: 3.9 mEq/L (ref 3.5–5.3)
Sodium: 137 mEq/L (ref 135–145)
Total Bilirubin: 0.3 mg/dL (ref 0.2–1.2)
Total Protein: 6.9 g/dL (ref 6.0–8.3)

## 2015-06-02 LAB — VITAMIN B12: VITAMIN B 12: 588 pg/mL (ref 211–911)

## 2015-06-04 ENCOUNTER — Other Ambulatory Visit: Payer: Self-pay | Admitting: Sports Medicine

## 2015-06-07 ENCOUNTER — Other Ambulatory Visit: Payer: Self-pay | Admitting: Physician Assistant

## 2015-06-12 ENCOUNTER — Other Ambulatory Visit: Payer: Self-pay | Admitting: Family Medicine

## 2015-07-08 ENCOUNTER — Other Ambulatory Visit: Payer: Self-pay | Admitting: Physician Assistant

## 2015-08-05 ENCOUNTER — Encounter: Payer: Self-pay | Admitting: Physician Assistant

## 2015-08-05 ENCOUNTER — Ambulatory Visit: Payer: Self-pay | Admitting: Physician Assistant

## 2015-08-05 ENCOUNTER — Ambulatory Visit (INDEPENDENT_AMBULATORY_CARE_PROVIDER_SITE_OTHER): Payer: BLUE CROSS/BLUE SHIELD | Admitting: Physician Assistant

## 2015-08-05 VITALS — BP 129/75 | HR 109 | Temp 98.4°F | Ht 65.5 in | Wt 173.0 lb

## 2015-08-05 DIAGNOSIS — J209 Acute bronchitis, unspecified: Secondary | ICD-10-CM

## 2015-08-05 DIAGNOSIS — Q796 Ehlers-Danlos syndrome, unspecified: Secondary | ICD-10-CM

## 2015-08-05 DIAGNOSIS — J441 Chronic obstructive pulmonary disease with (acute) exacerbation: Secondary | ICD-10-CM | POA: Diagnosis not present

## 2015-08-05 DIAGNOSIS — R0602 Shortness of breath: Secondary | ICD-10-CM

## 2015-08-05 DIAGNOSIS — F411 Generalized anxiety disorder: Secondary | ICD-10-CM

## 2015-08-05 DIAGNOSIS — F4310 Post-traumatic stress disorder, unspecified: Secondary | ICD-10-CM

## 2015-08-05 MED ORDER — CEFTRIAXONE SODIUM 1 G IJ SOLR
1.0000 g | Freq: Once | INTRAMUSCULAR | Status: AC
  Administered 2015-08-05: 1 g via INTRAMUSCULAR

## 2015-08-05 MED ORDER — ALPRAZOLAM 0.5 MG PO TABS: 0.5000 mg | ORAL_TABLET | Freq: Three times a day (TID) | ORAL | Status: DC | PRN

## 2015-08-05 MED ORDER — TRAMADOL HCL 50 MG PO TABS: ORAL_TABLET | ORAL | Status: DC

## 2015-08-05 MED ORDER — METHYLPREDNISOLONE ACETATE 80 MG/ML IJ SUSP
80.0000 mg | Freq: Once | INTRAMUSCULAR | Status: AC
  Administered 2015-08-05: 80 mg via INTRAMUSCULAR

## 2015-08-05 MED ORDER — IPRATROPIUM-ALBUTEROL 0.5-2.5 (3) MG/3ML IN SOLN
3.0000 mL | Freq: Once | RESPIRATORY_TRACT | Status: AC
Start: 2015-08-05 — End: 2015-08-05
  Administered 2015-08-05: 3 mL via RESPIRATORY_TRACT

## 2015-08-05 MED ORDER — ALBUTEROL SULFATE 108 (90 BASE) MCG/ACT IN AEPB
2.0000 | INHALATION_SPRAY | RESPIRATORY_TRACT | Status: AC | PRN
Start: 2015-08-05 — End: ?

## 2015-08-05 NOTE — Progress Notes (Signed)
   Subjective:    Patient ID: Signe Tackitt, female    DOB: 12/21/1965, 49 y.o.   MRN: [id number]  HPI  Patient is a 49 year old female with a history of COPD that comes in with acute bronchitis symptoms. She was seen on 08/02/2015 in the emergency room and given albuterol inhaler, Z-Pak, prednisone. She has not been able to afford these medications and has not taken anything. She certainly feels like she is worsening. She is becoming more and more short of breath. Her cough is very productive. Her chest is very tight. She does have her Symbicort which she is taking daily.  She does mention that she needs a refill on her Xanax and tramadol today. She uses her tramadol as needed only for her fibrodysplasia.   Review of Systems  All other systems reviewed and are negative.      Objective:   Physical Exam  Constitutional: She is oriented to person, place, and time.  HENT:  Head: Normocephalic and atraumatic.  Right Ear: External ear normal.  Left Ear: External ear normal.  Mouth/Throat: Oropharynx is clear and moist. No oropharyngeal exudate.  Eyes:  Bilateral injected conjunctiva with watery discharge  Neck: Normal range of motion. Neck supple.  Cardiovascular: Regular rhythm and normal heart sounds.   Tachycardia at 109.  Pulmonary/Chest:  Bilateral lungs with coarse breath sounds and rhonchi. No overt wheezing.  Lymphadenopathy:    She has no cervical adenopathy.  Neurological: She is alert and oriented to person, place, and time.  Skin: She is diaphoretic.  Psychiatric: She has a normal mood and affect.          Assessment & Plan:  COPD with exacerbation- since patient has not started any new medications we gave her a duo neb in office. Patient was given 1 g of Rocephin IM with Depo-Medrol 80 mg IM today. I discussed with patient places to get Z-Pak form affordable price and she does have insurance. She was given a good Rx coupon to help make this cheaper. She was given a  rest the click inhaler with coupon for one free. She can use this every 2-4 hours as needed for shortness of breath. Certainly would like for her to follow-up in 4 weeks. Or sooner if needed. Please let me know if not able to get an up by.  Fibrodysplasia-tramadol refilled for as needed uses to control pain.  Anxiety- xanax as needed refilled.

## 2015-08-05 NOTE — Patient Instructions (Signed)
Price zpak and prednisone.  Get abx first.

## 2015-08-17 ENCOUNTER — Other Ambulatory Visit: Payer: Self-pay | Admitting: Physician Assistant

## 2015-09-01 ENCOUNTER — Ambulatory Visit: Payer: Self-pay | Admitting: Physician Assistant

## 2015-09-02 ENCOUNTER — Other Ambulatory Visit: Payer: Self-pay | Admitting: Physician Assistant

## 2015-09-03 ENCOUNTER — Other Ambulatory Visit: Payer: Self-pay | Admitting: Physician Assistant

## 2015-09-09 ENCOUNTER — Other Ambulatory Visit: Payer: Self-pay | Admitting: Physician Assistant

## 2015-10-15 ENCOUNTER — Other Ambulatory Visit: Payer: Self-pay | Admitting: Physician Assistant

## 2015-10-15 ENCOUNTER — Telehealth: Payer: Self-pay

## 2015-10-15 NOTE — Telephone Encounter (Signed)
Arline AspCindy called and reports she just got out of Behavior Health due to depression and drinking. She is out for 1 day and will have to go into a long term facility for drinking. She wants a refill on her xanax. I advised her she will need a follow up appointment and I could schedule her for this afternoon. She hung up the phone.

## 2015-10-16 ENCOUNTER — Other Ambulatory Visit: Payer: Self-pay | Admitting: Family Medicine

## 2015-10-16 ENCOUNTER — Encounter: Payer: Self-pay | Admitting: Physician Assistant

## 2015-10-16 DIAGNOSIS — F1010 Alcohol abuse, uncomplicated: Secondary | ICD-10-CM | POA: Insufficient documentation

## 2015-10-16 NOTE — Telephone Encounter (Signed)
Thanks for update. She has seen neil downstairs in the past seems like that would be a good place to start. Most long term facilities don't allow benzo's as well.

## 2016-12-15 DEATH — deceased
